# Patient Record
Sex: Female | Born: 1985 | Race: Black or African American | Hispanic: No | Marital: Single | Smoking: Never smoker
Health system: Southern US, Community
[De-identification: ages and names within clinical notes are randomized; demographics above are authoritative.]

## PROBLEM LIST (undated history)

## (undated) DIAGNOSIS — E282 Polycystic ovarian syndrome: Secondary | ICD-10-CM

## (undated) DIAGNOSIS — I10 Essential (primary) hypertension: Secondary | ICD-10-CM

## (undated) HISTORY — DX: Essential (primary) hypertension: I10

## (undated) HISTORY — PX: APPENDECTOMY: SHX54

---
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
## 2004-04-09 ENCOUNTER — Emergency Department (HOSPITAL_COMMUNITY): Admission: EM | Admit: 2004-04-09 | Discharge: 2004-04-09 | Payer: Self-pay | Admitting: Emergency Medicine

## 2005-04-06 ENCOUNTER — Emergency Department (HOSPITAL_COMMUNITY): Admission: EM | Admit: 2005-04-06 | Discharge: 2005-04-06 | Payer: Self-pay | Admitting: Emergency Medicine

## 2005-04-07 ENCOUNTER — Inpatient Hospital Stay (HOSPITAL_COMMUNITY): Admission: EM | Admit: 2005-04-07 | Discharge: 2005-04-08 | Payer: Self-pay | Admitting: Emergency Medicine

## 2005-04-07 ENCOUNTER — Encounter (INDEPENDENT_AMBULATORY_CARE_PROVIDER_SITE_OTHER): Payer: Self-pay | Admitting: Specialist

## 2005-12-20 ENCOUNTER — Emergency Department (HOSPITAL_COMMUNITY): Admission: EM | Admit: 2005-12-20 | Discharge: 2005-12-21 | Payer: Self-pay | Admitting: Emergency Medicine

## 2006-02-02 ENCOUNTER — Emergency Department (HOSPITAL_COMMUNITY): Admission: EM | Admit: 2006-02-02 | Discharge: 2006-02-03 | Payer: Self-pay | Admitting: Emergency Medicine

## 2006-05-18 ENCOUNTER — Emergency Department (HOSPITAL_COMMUNITY): Admission: EM | Admit: 2006-05-18 | Discharge: 2006-05-19 | Payer: Self-pay | Admitting: Emergency Medicine

## 2006-08-07 ENCOUNTER — Emergency Department (HOSPITAL_COMMUNITY): Admission: EM | Admit: 2006-08-07 | Discharge: 2006-08-07 | Payer: Self-pay | Admitting: Emergency Medicine

## 2006-12-22 ENCOUNTER — Emergency Department (HOSPITAL_COMMUNITY): Admission: EM | Admit: 2006-12-22 | Discharge: 2006-12-22 | Payer: Self-pay | Admitting: Emergency Medicine

## 2007-04-21 ENCOUNTER — Emergency Department (HOSPITAL_COMMUNITY): Admission: EM | Admit: 2007-04-21 | Discharge: 2007-04-21 | Payer: Self-pay | Admitting: Emergency Medicine

## 2007-07-25 ENCOUNTER — Emergency Department (HOSPITAL_COMMUNITY): Admission: EM | Admit: 2007-07-25 | Discharge: 2007-07-25 | Payer: Self-pay | Admitting: Emergency Medicine

## 2007-10-04 ENCOUNTER — Emergency Department (HOSPITAL_COMMUNITY): Admission: EM | Admit: 2007-10-04 | Discharge: 2007-10-05 | Payer: Self-pay | Admitting: Emergency Medicine

## 2008-07-21 ENCOUNTER — Emergency Department (HOSPITAL_COMMUNITY): Admission: EM | Admit: 2008-07-21 | Discharge: 2008-07-22 | Payer: Self-pay | Admitting: Emergency Medicine

## 2008-10-29 ENCOUNTER — Ambulatory Visit: Payer: Self-pay | Admitting: Obstetrics and Gynecology

## 2008-10-30 ENCOUNTER — Ambulatory Visit (HOSPITAL_COMMUNITY): Admission: RE | Admit: 2008-10-30 | Discharge: 2008-10-30 | Payer: Self-pay | Admitting: Family Medicine

## 2008-10-30 ENCOUNTER — Encounter: Payer: Self-pay | Admitting: Obstetrics and Gynecology

## 2008-10-30 LAB — CONVERTED CEMR LAB
Hgb A1c MFr Bld: 5.8 % (ref 4.6–6.1)
TSH: 2.451 microintl units/mL (ref 0.350–4.500)

## 2008-11-12 ENCOUNTER — Ambulatory Visit: Payer: Self-pay | Admitting: Obstetrics and Gynecology

## 2008-12-26 ENCOUNTER — Emergency Department (HOSPITAL_COMMUNITY): Admission: EM | Admit: 2008-12-26 | Discharge: 2008-12-27 | Payer: Self-pay | Admitting: Emergency Medicine

## 2008-12-28 ENCOUNTER — Emergency Department (HOSPITAL_COMMUNITY): Admission: EM | Admit: 2008-12-28 | Discharge: 2008-12-29 | Payer: Self-pay | Admitting: Emergency Medicine

## 2009-12-01 ENCOUNTER — Emergency Department (HOSPITAL_COMMUNITY): Admission: EM | Admit: 2009-12-01 | Discharge: 2009-12-01 | Payer: Self-pay | Admitting: Family Medicine

## 2010-12-20 NOTE — Group Therapy Note (Signed)
NAME:  Anna Medina, Anna Medina NO.:  [id number]   MEDICAL RECORD NO.:  [id number]          PATIENT TYPE:  WOC   LOCATION:  [facility]                   FACILITY:  [facility]   PHYSICIAN:  Argentina Donovan, MD        DATE OF BIRTH:  Dec 17, 1985   DATE OF SERVICE:                                  CLINIC NOTE   HISTORY:  The patient is a 25 year old nulligravida African American  female, 6 feet tall, weighing 408 pounds who over a period of many years  has gained weight.  When her periods first started, they were regular,  but as of the last few years she has had 1 or at the most 2 periods in a  year.  She had a period February 2010 and her previous one was in June  2009.  Her periods when she has them, are like a normal period.  They  have not been heavy.  She does show signs of hirsutism on her abdomen  and her face, and she of course has morbid obesity.  I have spent a long  time talking to her about polycystic ovarian syndrome and explaining the  condition to her.  Also talking about the relationship to insulin  insensitivity.  She [facility]  which was normal.  She has been trying get an appointment here for a  year she said.  She has always had normal Pap smears.  She is currently  sexually active, but not using any contraceptive.  She was using Yaz for  a while at the health department, but it started her bleeding all  different times and she said she could not tolerate that.  She seems to  understand the problem.   We are going to start her on Provera 10 mg each day for the first 10  days of each month.  We are going to get a pelvic ultrasound and some  lab work.  I am going to have her come back in 2 weeks.  We will decide  whether to start her on Glucophage at that point.  We have discussed the  possibility of an IUD and some other alternatives for contraception if  she starts ovulating.   IMPRESSION:  1. Polycystic ovarian  syndrome.  2. Hirsutism.  3. Amenorrhea.           ______________________________  Argentina Donovan, MD     PR/MEDQ  D:  10/29/2008  T:  10/29/2008  Job:  [id number]

## 2010-12-23 NOTE — Op Note (Signed)
[name], [name]             ACCOUNT NO.:  [id number]   MEDICAL RECORD NO.:  [id number]          PATIENT TYPE:  INP   LOCATION:  0103                         FACILITY:  [facility]   PHYSICIAN:  Anselm Pancoast. Weatherly, M.D.DATE OF BIRTH:  1986-08-06   DATE OF PROCEDURE:  04/07/2005  DATE OF DISCHARGE:                                 OPERATIVE REPORT   PREOPERATIVE DIAGNOSES:  Acute appendicitis, morbid obesity.   POSTOPERATIVE DIAGNOSES:  Acute appendicitis, morbid obesity. Gangrenous  appendix.   OPERATION:  Open appendectomy.   ANESTHESIA:  General.   SURGEON:  Anselm Pancoast. Zachery Dakins, M.D.   ASSISTANT:  Two scrub nurses.   HISTORY:  Anna Medina is a 25 year old female whose had progressive  lower abdominal pain predominantly right for 24 hours. She went to the [facility]  Emergency Room but had a long wait, was never seen, checked out and came  over here and arrived about midnight. Her white count was not elevated but  there was a left shift and she was certainly tender in the lower right  abdomen. A CT was obtained that showed an acutely inflamed appendix lateral  and possibly retrocecal but probably lateral kind of in the anterior pelvis.  The patient was started on Unasyn 3 grams, permission obtained for surgery  and she is here at this time. I think that if this is a retrocecal appendix,  I definitely need to do an open and since it is not very clear and her size,  I think it would be very difficult to try to do it with a laparoscope and  preferred an open appendectomy.   The patient was taken to the operative suite, positioned on the OR table,  induction of general anesthesia, she had received her Unasyn. The abdomen  was prepped with Betadine surgical scrub and solution, draped in a sterile  manner. I had sort of marked the skin trying to get the panniculus and etc.  I draped her in a sterile manner and then started the skin incision, it was  about 3 inches in length,  about 6 inches in depth, 5 inches is fatty tissue.  The external oblique aponeurosis was identified and this was opened, we were  just lateral to the rectus and the internal oblique fibers were kind of  split __________ fibers exposing the peritoneum and transversalis. This was  carefully opened, the cecum was at this area and I switched to the left side  so that the assistant could be pulling vigorously for exposures and we were  using an appendiceal and a __________ plus a Deaver medially. I could kind  of rotate the cecum medially, identify the markedly inflamed appendix and  then could grasp it and kind of pull it up into the wound. The appendiceal  mesentery was divided between [facility] and ligated with 2-0 Vicryl with good  hemostasis in the appendix. Fortunately the base of the appendix was not  inflamed. I tied it with a 2-0 Vicryl and then a purse-string 3-0 silk. The  appendix body was removed, the purse-string tied and a second Z stitch was  placed  over it for security. There was not any evidence of any perforation.  We thoroughly irrigated, checked for hemostasis, it appears good. Closure  was very carefully done. First I started off with a 2-0 Vicryl but switched  to an #0 Vicryl for a little larger needle and the peritoneum and  transversalis was closed with a running layer, and then the internal oblique  with a few interrupted sutures of 2-0 Vicryl, and then the external oblique  was closed with either interrupted or figure-of-eights of #0 Vicryl. The  wound was thoroughly irrigated, his Scarpa's was closed with 3-0 Vicryl and  then 3-0 Vicryl subcuticular with Benzoin and Steri-Strips on the skin. The  patient tolerated the procedure nicely and  should be able to get up and go to the bathroom, be kind of limited on  liquids today and hopefully be ready for discharge tomorrow. Because of her  size, I will give her another couple of doses of antibiotics but hopefully  she want  have any problems postoperatively.           ______________________________  Anselm Pancoast. Zachery Dakins, M.D.     WJW/MEDQ  D:  04/07/2005  T:  04/07/2005  Job:  [id number]

## 2010-12-23 NOTE — H&P (Signed)
NAMEJANESSA, Medina             ACCOUNT NO.:  [id number]   MEDICAL RECORD NO.:  [id number]          PATIENT TYPE:  INP   LOCATION:  0103                         FACILITY:  [facility]   PHYSICIAN:  Anselm Pancoast. Weatherly, M.D.DATE OF BIRTH:  [birth year]   DATE OF ADMISSION:  04/07/2005  DATE OF DISCHARGE:                                HISTORY & PHYSICAL   CHIEF COMPLAINT:  Abdominal pain.   HISTORY:  Anna Medina is a 25 year old approximately 360 pound black  female who started having abdominal pain yesterday. She went to the [facility]  emergency room, waited several hours, was not being seen and then in  frustration gave up and came to our emergency room, arriving here  approximately midnight. She was seen by the ER physician, she was definitely  tender in the lower abdomen. Her white count was not elevated at 9100 but  there was a significant left shift. She was given a contrast CT of the  abdomen that showed an acutely inflamed appendix kind of down lateral and  not truly retrocecal and I was called this morning approximately 5:45. I saw  her and she was definitely locally tender. She is also definitely extremely  heavy and as far as discussion of the options of laparoscopic versus an open  appendectomy, I think with her size and etc. that it would be safer because  I fear we are going  see a retrocecal appendix. I recommended we proceed  with an open appendectomy. Fortunately it was the time the employees were  arriving so that we will have help for our surgery.   PAST MEDICAL HISTORY:  She is on no chronic medications. She does not smoke  cigarettes. All of her family members are large and she has had no  pregnancies and she is not married.   PHYSICAL EXAMINATION:  GENERAL:  She is a pleasant, very heavy but young  appearing female complaining of pretty significant abdominal pain.  VITAL SIGNS:  In the emergency room, her temperature has always been normal  about the 97-98 range,  blood pressure is 118/77, pulse has been from 78-90  and her respirations are normal.  EYES/EARS/NOSE/THROAT:  She is well hydrated, there is no cervical  lymphadenopathy, good breath sounds.  BREASTS:  Negative.  CARDIAC:  Normal sinus rhythm.  ABDOMEN:  She is very tender in the right lower quadrant, she has a large  panniculus but the tenderness appears right __________ the anterior iliac  crest area well localized. The CT also shows the appendix is kind of lateral  to the cecum. Did not do a pelvic or rectal exam since confirmed  appendicitis by CT. She has got 3 grams of Unasyn, permission obtained for  open appendectomy and will proceed as soon as the OR can be set up for the  procedure.           ______________________________  Anselm Pancoast. Zachery Dakins, M.D.     WJW/MEDQ  D:  04/07/2005  T:  04/07/2005  Job:  [id number]

## 2011-04-28 LAB — HEMOGLOBIN AND HEMATOCRIT, BLOOD: HCT: 34.3 — ABNORMAL LOW

## 2011-05-11 LAB — GLUCOSE, CAPILLARY: Glucose-Capillary: 88 mg/dL (ref 70–99)

## 2011-05-19 LAB — COMPREHENSIVE METABOLIC PANEL
Albumin: 3.3 — ABNORMAL LOW
Alkaline Phosphatase: 73
BUN: 7
Calcium: 9.2
Creatinine, Ser: 0.72
Potassium: 4
Total Protein: 7.8

## 2011-05-19 LAB — DIFFERENTIAL
Basophils Absolute: 0
Basophils Relative: 1
Eosinophils Absolute: 0.1
Lymphocytes Relative: 25
Lymphs Abs: 1.2
Monocytes Absolute: 0.6
Monocytes Relative: 12 — ABNORMAL HIGH
Neutro Abs: 2.8

## 2011-05-19 LAB — URINALYSIS, ROUTINE W REFLEX MICROSCOPIC
Glucose, UA: NEGATIVE
Ketones, ur: NEGATIVE
Protein, ur: NEGATIVE

## 2011-05-19 LAB — CBC
HCT: 37.6
MCHC: 34.7
Platelets: 226
RDW: 13.1

## 2011-05-19 LAB — LIPASE, BLOOD: Lipase: 19

## 2011-05-19 LAB — PREGNANCY, URINE: Preg Test, Ur: NEGATIVE

## 2011-06-22 ENCOUNTER — Encounter: Payer: Self-pay | Admitting: Emergency Medicine

## 2011-06-22 ENCOUNTER — Emergency Department (HOSPITAL_COMMUNITY)
Admission: EM | Admit: 2011-06-22 | Discharge: 2011-06-22 | Disposition: A | Discharge status: Home / Self Care | Payer: Self-pay | Attending: Emergency Medicine | Admitting: Emergency Medicine

## 2011-06-22 ENCOUNTER — Emergency Department (HOSPITAL_COMMUNITY): Payer: Self-pay

## 2011-06-22 DIAGNOSIS — R112 Nausea with vomiting, unspecified: Secondary | ICD-10-CM | POA: Insufficient documentation

## 2011-06-22 DIAGNOSIS — R51 Headache: Secondary | ICD-10-CM | POA: Insufficient documentation

## 2011-06-22 HISTORY — DX: Polycystic ovarian syndrome: E28.2

## 2011-06-22 MED ORDER — SODIUM CHLORIDE 0.9 % IV SOLN
INTRAVENOUS | Status: DC
  Administered 2011-06-22: 10:00:00 via INTRAVENOUS

## 2011-06-22 MED ORDER — DIPHENHYDRAMINE HCL 50 MG/ML IJ SOLN
50.0000 mg | Freq: Once | INTRAMUSCULAR | Status: AC
  Administered 2011-06-22: 50 mg via INTRAVENOUS
  Filled 2011-06-22: qty 1

## 2011-06-22 MED ORDER — METOCLOPRAMIDE HCL 5 MG/ML IJ SOLN
10.0000 mg | Freq: Once | INTRAMUSCULAR | Status: AC
  Administered 2011-06-22: 10 mg via INTRAVENOUS
  Filled 2011-06-22: qty 2

## 2011-06-22 NOTE — ED Provider Notes (Signed)
History     CSN: [id number] Arrival date & time: 06/22/2011  8:48 AM    Chief Complaint  Patient presents with  . Headache  . Emesis  . Nausea   HPI Pt was seen at 0900.  Per pt, c/o gradual onset and persistence of constant right sided frontal headache since yesterday.  Describes the headache as "throbbing," has been assoc with N/V.  States she took a friend's vicodin last night with relief of the headache, but it came back today.  Pt endorses she has a hx of similar headaches.  Denies headache was sudden or maximal in onset or any any time.  Denies fevers, no visual changes, no eye pain, no focal motor weakness, no tingling/numbness in extremities.    Past Medical History  Diagnosis Date  . PCOS (polycystic ovarian syndrome)     Past Surgical History  Procedure Date  . Appendectomy      History  Substance Use Topics  . Smoking status: Never Smoker   . Smokeless tobacco: Not on file  . Alcohol Use: No   Review of Systems ROS: Statement: All systems negative except as marked or noted in the HPI; Constitutional: Negative for fever and chills. ; ; Eyes: Negative for eye pain, redness and discharge. ; ; ENMT: Negative for ear pain, hoarseness, nasal congestion, sinus pressure and sore throat. ; ; Cardiovascular: Negative for chest pain, palpitations, diaphoresis, dyspnea and peripheral edema. ; ; Respiratory: Negative for cough, wheezing and stridor. ; ; Gastrointestinal: +N/V.  Negative for diarrhea and abdominal pain, blood in stool, hematemesis, jaundice and rectal bleeding. . ; ; Genitourinary: Negative for dysuria, flank pain and hematuria. ; ; Musculoskeletal: Negative for back pain and neck pain. Negative for swelling and trauma.; ; Skin: Negative for pruritus, rash, abrasions, blisters, bruising and skin lesion.; ; Neuro: +frontal headache. Negative for lightheadedness and neck stiffness. Negative for weakness, altered level of consciousness , altered mental status, extremity  weakness, paresthesias, involuntary movement, seizure and syncope.     Allergies  Review of patient's allergies indicates no known allergies.  Home Medications   Current Outpatient Rx  Name Route Sig Dispense Refill  . ASPIRIN 325 MG PO TABS Oral Take 650 mg by mouth once.      Marland Kitchen DIPHENHYDRAMINE-APAP (SLEEP) 25-500 MG PO TABS Oral Take 1 tablet by mouth at bedtime as needed. sleep     . OXYCODONE HCL 5 MG PO CAPS Oral Take 10 mg by mouth once.        BP 160/104  Pulse 80  Temp(Src) 98 F (36.7 C) (Oral)  Resp 16  SpO2 99%  LMP 06/12/2011  Physical Exam 0905: Physical examination:  Nursing notes reviewed; Vital signs and O2 SAT reviewed;  Constitutional: Well developed, Well nourished, Well hydrated, In no acute distress; Head:  Normocephalic, atraumatic; Eyes: EOMI, PERRL, No scleral icterus; ENMT: TM's clear bilat.  +edemetous nasal turbinates bilat with clear rhinorrhea.  Mouth and pharynx normal, Mucous membranes moist; Neck: Supple, Full range of motion, No lymphadenopathy, no meningeal signs; Cardiovascular: Regular rate and rhythm, No murmur, rub, or gallop; Respiratory: Breath sounds clear & equal bilaterally, No rales, rhonchi, wheezes, or rub, Normal respiratory effort/excursion; Chest: Nontender, Movement normal; Abdomen: Soft, Nontender, Nondistended, Normal bowel sounds; Extremities: Pulses normal, No tenderness, No edema, No calf edema or asymmetry.; Neuro: AA&Ox3, Major CN grossly intact.  No facial droop, speech clear. No gross focal motor or sensory deficits in extremities.; Skin: Color normal, Warm, Dry, no rash.  ED Course  Procedures   MDM  MDM Reviewed: nursing note and vitals Interpretation: CT scan    Ct Head Wo Contrast  06/22/2011  *RADIOLOGY REPORT*  Clinical Data:  Headache.  Nausea and vomiting.  CT HEAD WITHOUT CONTRAST  Technique: Contiguous axial images were obtained from the base of the skull through the vertex without contrast  Comparison:  None  Findings:  There is no evidence of intracranial hemorrhage, brain edema, or other signs of acute infarction.  There is no evidence of intracranial mass lesion or mass effect.  No abnormal extraaxial fluid collections are identified.  There is no evidence of hydrocephalus, or other significant intracranial abnormality.  No skull abnormality identified.  IMPRESSION: Negative non-contrast head CT.  Original Report Authenticated By: Danae Orleans, M.D.    11:56 AM:   Feels improved and wants to go home now.  Dx testing d/w pt and family.  Questions answered.  Verb understanding, agreeable to d/c home with outpt f/u.       Lunna Vogelgesang Allison Quarry, DO 06/23/11 2155

## 2011-06-22 NOTE — ED Notes (Signed)
Patient transported to CT 

## 2011-06-22 NOTE — ED Notes (Signed)
Pt aox3, states ha since yesterday, 8/10 no hx migraines.

## 2011-06-22 NOTE — ED Notes (Signed)
Pt states that she had a headache yesterday with some n/v and that she felt better then this am having more nausea/v prior to going to work alert x4,

## 2011-06-22 NOTE — ED Notes (Signed)
Pt denies headache at this time also denies n/v. Pt states feels much better. amb pt to discharge window.

## 2011-11-30 IMAGING — CT CT HEAD W/O CM
2 series · 16 of 30 positions shown, 20 images · non-contrast
Comparison: None

CLINICAL DATA: Headache.  Nausea and vomiting.

CT HEAD WITHOUT CONTRAST
TECHNIQUE: Contiguous axial images were obtained from the base of
the skull through the vertex without contrast

[Series 2: head w/o · axial · non-contrast · 0.43mm/px · z∈[-633,-513]mm · 13 of 28 slices shown, 17 images]
[im 2/28  brain]
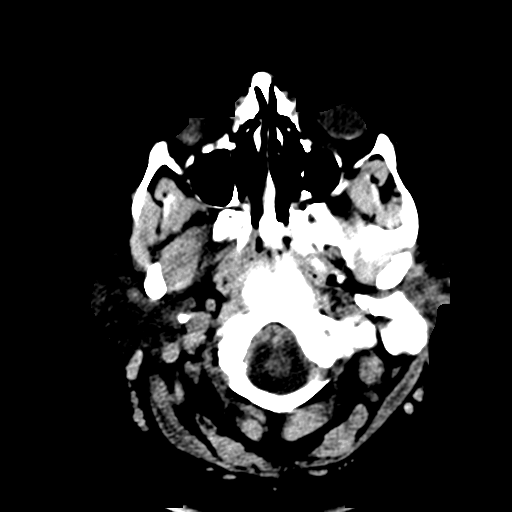
[im 2/28  bone]
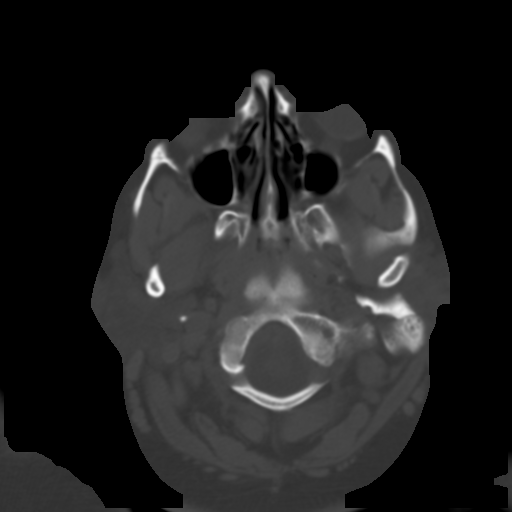
[im 4/28  brain]
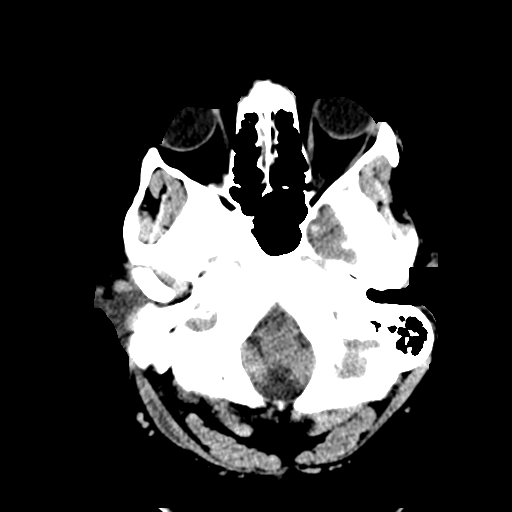
[im 6/28  brain]
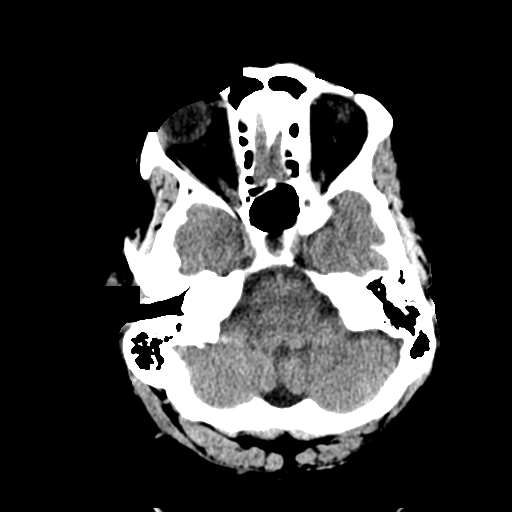
[im 8/28  brain]
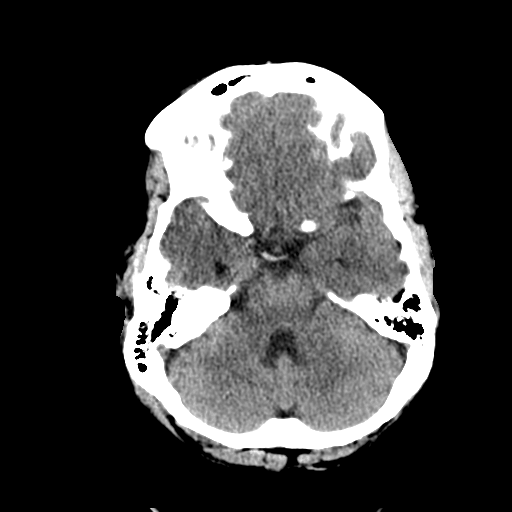
[im 10/28  brain]
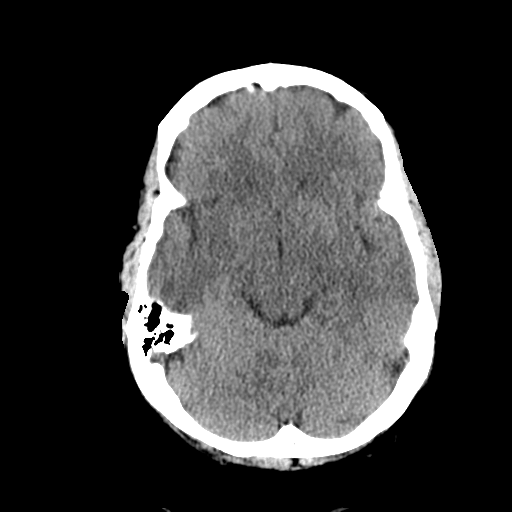
[im 10/28  bone]
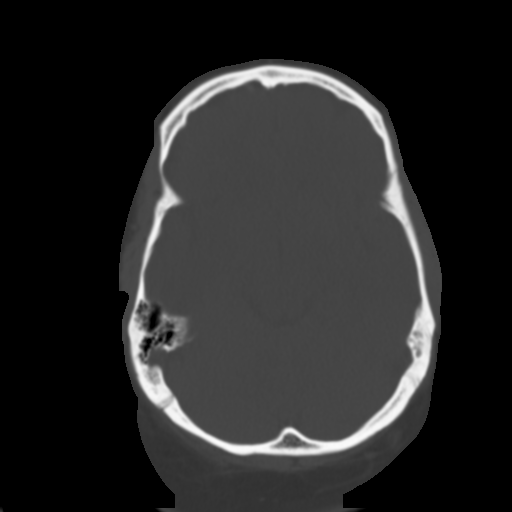
[im 12/28  brain]
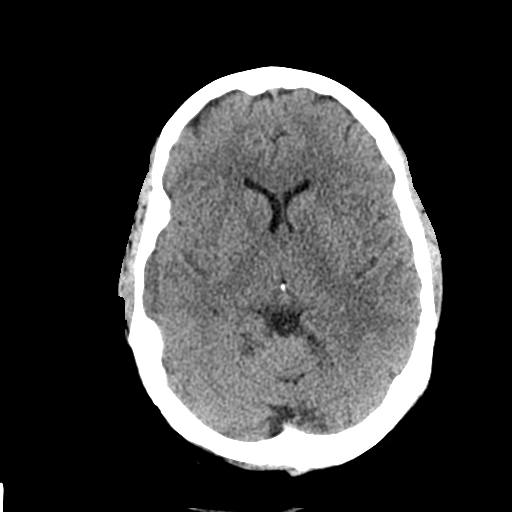
[im 14/28  brain]
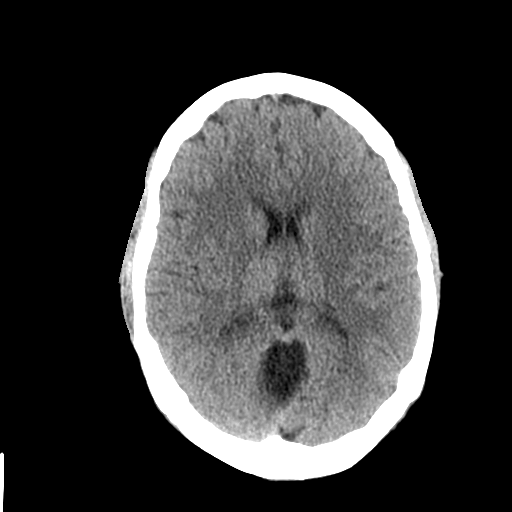
[im 16/28  brain]
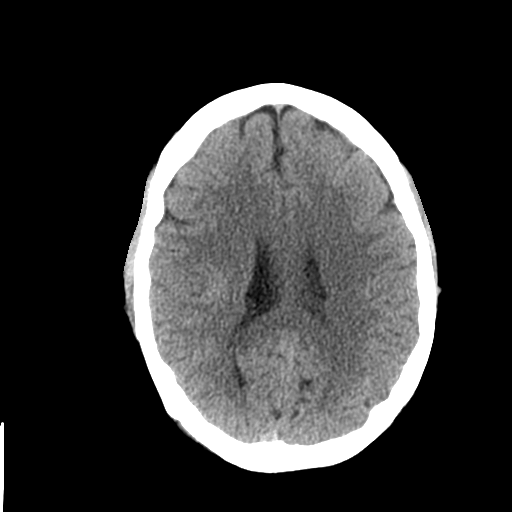
[im 18/28  brain]
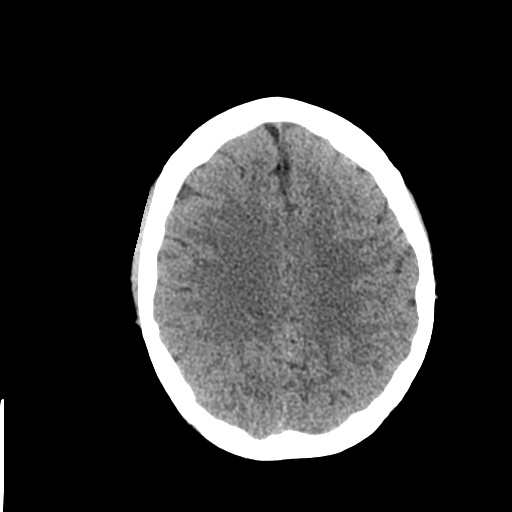
[im 18/28  bone]
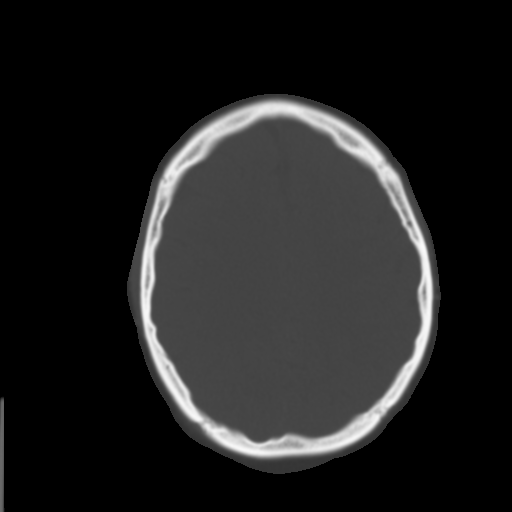
[im 20/28  brain]
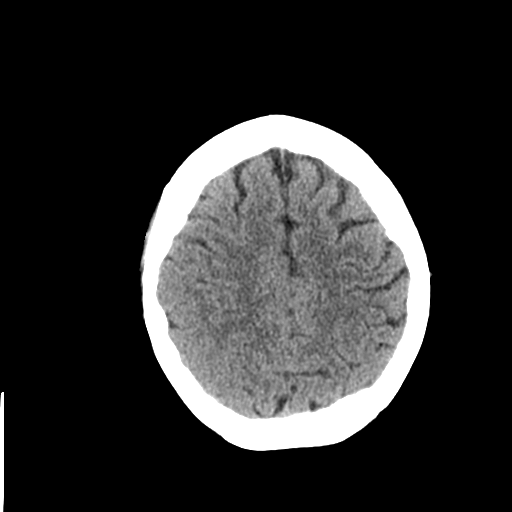
[im 22/28  brain]
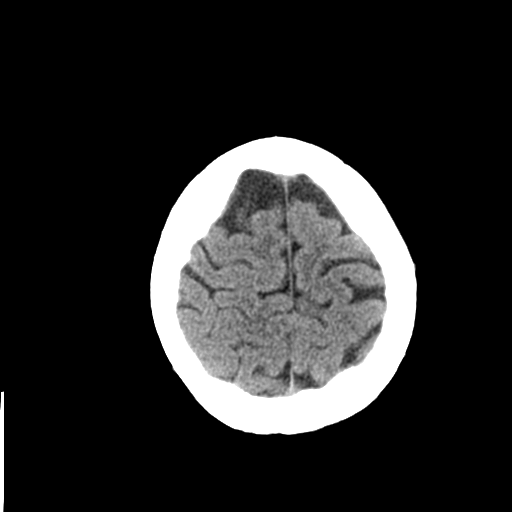
[im 24/28  brain]
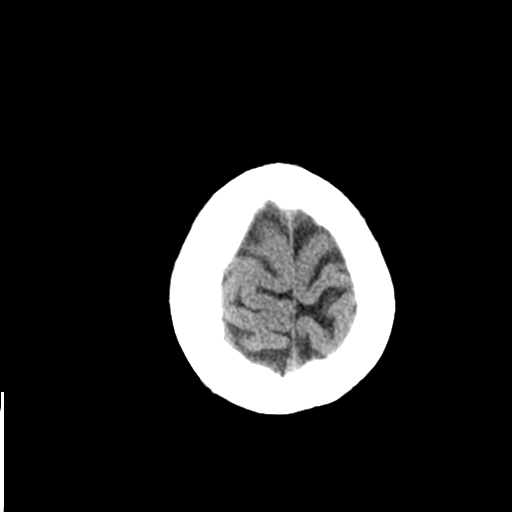
[im 26/28  brain]
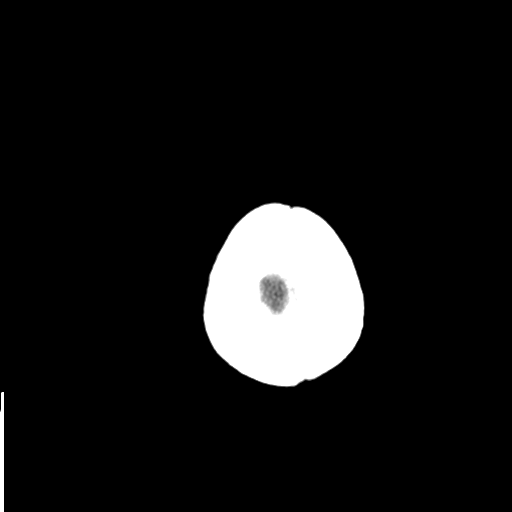
[im 26/28  bone]
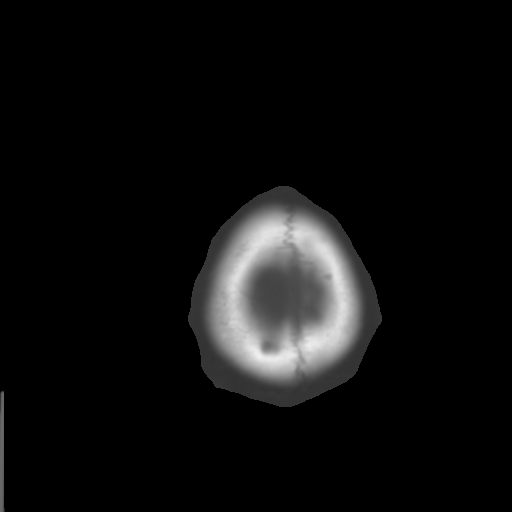

[Series 3: bone windows · axial · 0.43mm/px · z∈[-633,-593]mm · 3 of 28 slices shown]
[im 2/28  bone]
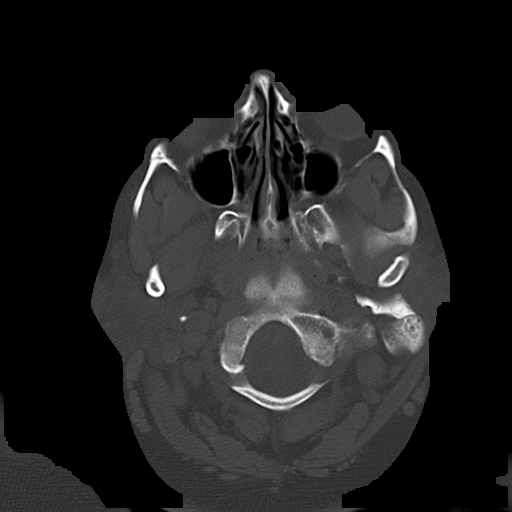
[im 6/28  bone]
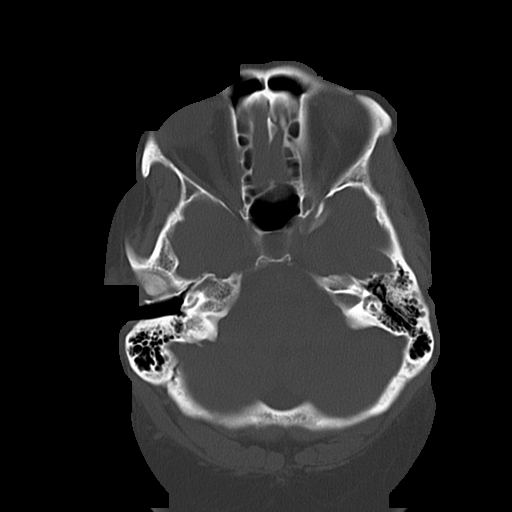
[im 10/28  bone]
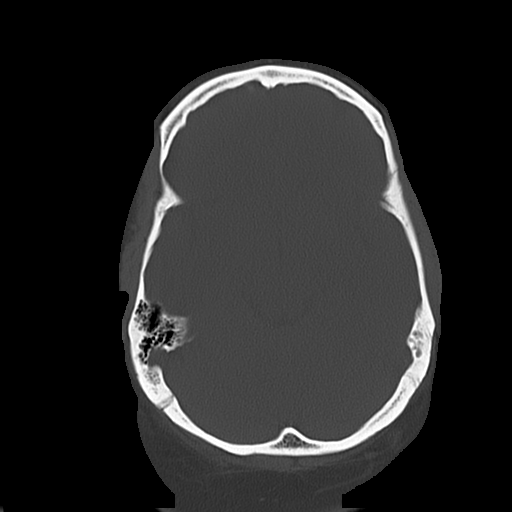

[16 of 30 positions shown; findings below may reference images not displayed]

FINDINGS: There is no evidence of intracranial hemorrhage, brain
edema, or other signs of acute infarction.  There is no evidence of
intracranial mass lesion or mass effect.  No abnormal extraaxial
fluid collections are identified.  There is no evidence of
hydrocephalus, or other significant intracranial abnormality.  No
skull abnormality identified.
IMPRESSION: Negative non-contrast head CT.

## 2012-09-18 ENCOUNTER — Emergency Department (HOSPITAL_COMMUNITY)
Admission: EM | Admit: 2012-09-18 | Discharge: 2012-09-18 | Disposition: A | Discharge status: Home / Self Care | Payer: Self-pay | Attending: Emergency Medicine | Admitting: Emergency Medicine

## 2012-09-18 ENCOUNTER — Encounter (HOSPITAL_COMMUNITY): Payer: Self-pay | Admitting: *Deleted

## 2012-09-18 DIAGNOSIS — Z8739 Personal history of other diseases of the musculoskeletal system and connective tissue: Secondary | ICD-10-CM | POA: Insufficient documentation

## 2012-09-18 DIAGNOSIS — Y929 Unspecified place or not applicable: Secondary | ICD-10-CM | POA: Insufficient documentation

## 2012-09-18 DIAGNOSIS — IMO0002 Reserved for concepts with insufficient information to code with codable children: Secondary | ICD-10-CM

## 2012-09-18 DIAGNOSIS — Z23 Encounter for immunization: Secondary | ICD-10-CM | POA: Insufficient documentation

## 2012-09-18 DIAGNOSIS — W268XXA Contact with other sharp object(s), not elsewhere classified, initial encounter: Secondary | ICD-10-CM | POA: Insufficient documentation

## 2012-09-18 DIAGNOSIS — S61209A Unspecified open wound of unspecified finger without damage to nail, initial encounter: Secondary | ICD-10-CM | POA: Insufficient documentation

## 2012-09-18 DIAGNOSIS — Y9389 Activity, other specified: Secondary | ICD-10-CM | POA: Insufficient documentation

## 2012-09-18 MED ORDER — TETANUS-DIPHTH-ACELL PERTUSSIS 5-2.5-18.5 LF-MCG/0.5 IM SUSP
0.5000 mL | Freq: Once | INTRAMUSCULAR | Status: AC
  Administered 2012-09-18: 0.5 mL via INTRAMUSCULAR
  Filled 2012-09-18: qty 0.5

## 2012-09-18 NOTE — ED Provider Notes (Signed)
History     CSN: [id number]  Arrival date & time 09/18/12  1914   First MD Initiated Contact with Patient 09/18/12 2006      Chief Complaint  Patient presents with  . Extremity Laceration    (Consider location/radiation/quality/duration/timing/severity/associated sxs/prior treatment) HPI Comments: This is a 27 year old female, who presents emergency department with chief complaint of finger laceration. Patient states that she was opening a can, when she cut her finger. Bleeding is controlled at this time. She states that her pain is mild. She cannot remember the last time she had a tetanus shot.  The history is provided by the patient. No language interpreter was used.    Past Medical History  Diagnosis Date  . PCOS (polycystic ovarian syndrome)     Past Surgical History  Procedure Laterality Date  . Appendectomy      History reviewed. No pertinent family history.  History  Substance Use Topics  . Smoking status: Never Smoker   . Smokeless tobacco: Not on file  . Alcohol Use: No    OB History   Grav Para Term Preterm Abortions TAB SAB Ect Mult Living                  Review of Systems  All other systems reviewed and are negative.    Allergies  Review of patient's allergies indicates no known allergies.  Home Medications   Current Outpatient Rx  Name  Route  Sig  Dispense  Refill  . ibuprofen (ADVIL,MOTRIN) 200 MG tablet   Oral   Take 400 mg by mouth every 6 (six) hours as needed for pain.           BP 160/80  Pulse 77  Temp(Src) 98.6 F (37 C) (Oral)  Resp 18  SpO2 97%  LMP 09/18/2012  Physical Exam  Nursing note and vitals reviewed. Constitutional: She is oriented to person, place, and time. She appears well-developed and well-nourished.  HENT:  Head: Normocephalic and atraumatic.  Neck: Normal range of motion.  Cardiovascular: Normal rate.   Pulmonary/Chest: Effort normal.  Abdominal: She exhibits no distension.  Musculoskeletal:  Normal range of motion. She exhibits no edema and no tenderness.  Right hand grip strength 5 out of 5, range of motion 5 out of 5, no signs of tendon damage.  Neurological: She is alert and oriented to person, place, and time.  Skin: Skin is warm and dry.  Right fifth finger laceration at the MCP on the palmar aspect, approximately 3 cm, no signs of tendon involvement, no debris in the wound, bleeding is controlled at this time  Psychiatric: She has a normal mood and affect. Her behavior is normal. Judgment and thought content normal.    ED Course  Procedures (including critical care time)  Labs Reviewed - No data to display No results found. LACERATION REPAIR Performed by: Roxy Horseman Authorized by: Roxy Horseman Consent: Verbal consent obtained. Risks and benefits: risks, benefits and alternatives were discussed Consent given by: patient Patient identity confirmed: provided demographic data Prepped and Draped in normal sterile fashion Wound explored  Laceration Location: Pulmonary aspect of right fifth finger  Laceration Length: 3 cm  No Foreign Bodies seen or palpated  Anesthesia: local infiltration  Local anesthetic: lidocaine 2 % without epinephrine  Anesthetic total: 3 ml  Irrigation method: syringe Amount of cleaning: standard  Skin closure: 5-0 monofilament   Number of sutures: 7   Technique: Simple interrupted   Patient tolerance: Patient tolerated the procedure well  with no immediate complications.   1. Laceration       MDM  27 year female with finger laceration. There appears to be no tendon involvement.  I examined the wound thoroughly with Dr. Estell Harpin.  The wound was repaired without incident.  Patient will follow-up in 7 days for suture removal.  She is agreeable with the plan.  She is stable and ready for discharge.  TDaP was updated.        Roxy Horseman, PA-C 09/18/12 2123

## 2012-09-18 NOTE — ED Notes (Signed)
Pt was reaching in a can and lacerated her right fifth digit. Bleeding has ceased with pressure from ems. Pt does not remember when last tetnus was.

## 2012-09-18 NOTE — ED Notes (Signed)
[id number]<AV> Expected date:<BR> Expected time:<BR> Means of arrival:<BR> Comments:<BR> EMS/cut pinkie finger on metal can

## 2012-09-18 NOTE — ED Provider Notes (Signed)
Medical screening examination/treatment/procedure(s) were performed by non-physician practitioner and as supervising physician I was immediately available for consultation/collaboration.   Kirandeep Fariss L Uldine Fuster, MD 09/18/12 2241 

## 2012-09-25 ENCOUNTER — Emergency Department (HOSPITAL_COMMUNITY)
Admission: EM | Admit: 2012-09-25 | Discharge: 2012-09-25 | Disposition: A | Discharge status: Home / Self Care | Payer: Self-pay | Attending: Emergency Medicine | Admitting: Emergency Medicine

## 2012-09-25 ENCOUNTER — Encounter (HOSPITAL_COMMUNITY): Payer: Self-pay | Admitting: Emergency Medicine

## 2012-09-25 DIAGNOSIS — Z4889 Encounter for other specified surgical aftercare: Secondary | ICD-10-CM

## 2012-09-25 DIAGNOSIS — Z8742 Personal history of other diseases of the female genital tract: Secondary | ICD-10-CM | POA: Insufficient documentation

## 2012-09-25 DIAGNOSIS — Z4802 Encounter for removal of sutures: Secondary | ICD-10-CM | POA: Insufficient documentation

## 2012-09-25 NOTE — ED Provider Notes (Signed)
History     CSN: [id number]  Arrival date & time 09/25/12  1100   First MD Initiated Contact with Patient 09/25/12 1114      Chief Complaint  Patient presents with  . Suture / Staple Removal    approx 7 sutures on r/hand, base of 5th finger    (Consider location/radiation/quality/duration/timing/severity/associated sxs/prior treatment) HPI Patient presents for suture removal.  Patient is told to return here in 7 days.  Patient states, is in no signs of infection Past Medical History  Diagnosis Date  . PCOS (polycystic ovarian syndrome)     Past Surgical History  Procedure Laterality Date  . Appendectomy      History reviewed. No pertinent family history.  History  Substance Use Topics  . Smoking status: Never Smoker   . Smokeless tobacco: Not on file  . Alcohol Use: No    OB History   Grav Para Term Preterm Abortions TAB SAB Ect Mult Living                  Review of Systems All other systems negative except as documented in the HPI. All pertinent positives and negatives as reviewed in the HPI. Allergies  Apple fruit extract  Home Medications   Current Outpatient Rx  Name  Route  Sig  Dispense  Refill  . hydrocortisone cream 1 %   Topical   Apply 1 application topically as needed. For eczema.         Marland Kitchen ibuprofen (ADVIL,MOTRIN) 200 MG tablet   Oral   Take 400 mg by mouth every 6 (six) hours as needed for pain. For pain.           BP 117/88  Pulse 72  Temp(Src) 98.3 F (36.8 C)  SpO2 99%  LMP 09/18/2012  Physical Exam  Nursing note and vitals reviewed. Constitutional: She appears well-developed and well-nourished.  Musculoskeletal:  Patient has a laceration to the base of her left fifth digit on the palmar aspect sutures are in place.  The wound does not appear ready for suture removal as they're still appears to be healing.    ED Course  Procedures (including critical care time)  Advised that she return here in another 5-7 days for  suture removal as 7 days in this area dispense insufficient for healing.  Patient is agreeable to this plan and advised to return sooner for any worsening   MDM          Carlyle Dolly, PA-C 09/25/12 1137

## 2012-09-25 NOTE — ED Provider Notes (Signed)
Medical screening examination/treatment/procedure(s) were performed by non-physician practitioner and as supervising physician I was immediately available for consultation/collaboration.  Donnetta Hutching, MD 09/25/12 1525

## 2012-09-25 NOTE — ED Notes (Signed)
Pt has a resolving sute line at the base of 5th finger r/hand. approx 7 sutures. Suture line intact. Denies drainage

## 2012-10-02 ENCOUNTER — Emergency Department (HOSPITAL_COMMUNITY)
Admission: EM | Admit: 2012-10-02 | Discharge: 2012-10-02 | Disposition: A | Discharge status: Home / Self Care | Payer: Self-pay | Attending: Emergency Medicine | Admitting: Emergency Medicine

## 2012-10-02 DIAGNOSIS — Z862 Personal history of diseases of the blood and blood-forming organs and certain disorders involving the immune mechanism: Secondary | ICD-10-CM | POA: Insufficient documentation

## 2012-10-02 DIAGNOSIS — Z4802 Encounter for removal of sutures: Secondary | ICD-10-CM | POA: Insufficient documentation

## 2012-10-02 DIAGNOSIS — Z8639 Personal history of other endocrine, nutritional and metabolic disease: Secondary | ICD-10-CM | POA: Insufficient documentation

## 2012-10-02 NOTE — ED Provider Notes (Signed)
History    This chart was scribed for non-physician practitioner working with Nelia Shi, MD by Smitty Pluck, ED scribe. This patient was seen in room WTR5/WTR5 and the patient's care was started at 5:01 PM.   CSN: [id number]  Arrival date & time 10/02/12  1623      No chief complaint on file.    The history is provided by the patient and medical records. No language interpreter was used.   GWENNETH WHITEMAN is a 27 y.o. female who presents to the Emergency Department due to suture removal from base of right 5th finger. Pt reports that she cut her right 5th finger while opening a can 2 weeks ago and had 7  sutures placed  She returned to the ED for suture removal 1 week ago and was told to come back in 1 week to allow proper healing. She denies pain at site of laceration, drainage, fever, chills, nausea, vomiting, diarrhea, weakness, cough, SOB and any other pain.   Past Medical History  Diagnosis Date  . PCOS (polycystic ovarian syndrome)     Past Surgical History  Procedure Laterality Date  . Appendectomy      No family history on file.  History  Substance Use Topics  . Smoking status: Never Smoker   . Smokeless tobacco: Not on file  . Alcohol Use: No    OB History   Grav Para Term Preterm Abortions TAB SAB Ect Mult Living                  Review of Systems  Constitutional: Negative for fever and chills.  Respiratory: Negative for cough and shortness of breath.   Gastrointestinal: Negative for nausea, vomiting and diarrhea.  Neurological: Negative for weakness.  All other systems reviewed and are negative.    Allergies  Apple fruit extract  Home Medications   Current Outpatient Rx  Name  Route  Sig  Dispense  Refill  . hydrocortisone cream 1 %   Topical   Apply 1 application topically as needed. For eczema.         Marland Kitchen ibuprofen (ADVIL,MOTRIN) 200 MG tablet   Oral   Take 400 mg by mouth every 6 (six) hours as needed for pain. For pain.            BP 150/101  Pulse 83  Temp(Src) 98.5 F (36.9 C) (Oral)  Resp 16  SpO2 100%  LMP 09/18/2012  Physical Exam  Nursing note and vitals reviewed. Constitutional: She is oriented to person, place, and time. She appears well-developed and well-nourished. No distress.  obese  HENT:  Head: Normocephalic and atraumatic.  Mouth/Throat: Oropharynx is clear and moist.  Eyes: Conjunctivae and EOM are normal.  Neck: Normal range of motion. Neck supple.  Cardiovascular: Normal rate, regular rhythm and normal heart sounds.   Pulmonary/Chest: Effort normal and breath sounds normal. No respiratory distress.  Abdominal: Soft. Bowel sounds are normal. She exhibits no distension.  Musculoskeletal: Normal range of motion. She exhibits no edema.  Neurological: She is alert and oriented to person, place, and time. No sensory deficit.  Skin: Skin is warm and dry.  7 sutures removed base right 5th finger MCP joint Non erythematous  No signs of infection Well healed No swelling    Psychiatric: She has a normal mood and affect. Her behavior is normal.    ED Course  Procedures (including critical care time) SUTURE REMOVAL Performed by: Johnnette Gourd  Consent: Verbal consent obtained. Patient  identity confirmed: provided demographic data Time out: Immediately prior to procedure a "time out" was called to verify the correct patient, procedure, equipment, support staff and site/side marked as required.  Location details: right 5th digit  Wound Appearance: clean  Sutures/Staples Removed: 7  Facility: sutures placed in this facility Patient tolerance: Patient tolerated the procedure well with no immediate complications.   DIAGNOSTIC STUDIES: Oxygen Saturation is 100% on room air, normal by my interpretation.    COORDINATION OF CARE: 5:07 PM Discussed ED treatment with pt and pt agrees.     Labs Reviewed - No data to display No results found.   1. Visit for suture removal        MDM  7 sutures removed. Wound care given. Well healed. No signs of infection.      I personally performed the services described in this documentation, which was scribed in my presence. The recorded information has been reviewed and is accurate.    Trevor Mace, PA-C 10/02/12 1734

## 2012-10-02 NOTE — ED Notes (Signed)
Pt had sutures in hand put in two weeks ago and has come to have them removed.

## 2012-10-06 NOTE — ED Provider Notes (Signed)
Medical screening examination/treatment/procedure(s) were performed by non-physician practitioner and as supervising physician I was immediately available for consultation/collaboration.    Aristidis Talerico L Kasha Howeth, MD 10/06/12 1507 

## 2013-02-02 ENCOUNTER — Emergency Department (HOSPITAL_COMMUNITY)
Admission: EM | Admit: 2013-02-02 | Discharge: 2013-02-02 | Disposition: A | Discharge status: Home / Self Care | Payer: BC Managed Care – PPO | Attending: Emergency Medicine | Admitting: Emergency Medicine

## 2013-02-02 ENCOUNTER — Encounter (HOSPITAL_COMMUNITY): Payer: Self-pay | Admitting: *Deleted

## 2013-02-02 DIAGNOSIS — Z862 Personal history of diseases of the blood and blood-forming organs and certain disorders involving the immune mechanism: Secondary | ICD-10-CM | POA: Insufficient documentation

## 2013-02-02 DIAGNOSIS — Z8639 Personal history of other endocrine, nutritional and metabolic disease: Secondary | ICD-10-CM | POA: Insufficient documentation

## 2013-02-02 DIAGNOSIS — K0889 Other specified disorders of teeth and supporting structures: Secondary | ICD-10-CM

## 2013-02-02 DIAGNOSIS — K029 Dental caries, unspecified: Secondary | ICD-10-CM | POA: Insufficient documentation

## 2013-02-02 DIAGNOSIS — K089 Disorder of teeth and supporting structures, unspecified: Secondary | ICD-10-CM | POA: Insufficient documentation

## 2013-02-02 MED ORDER — PROMETHAZINE HCL 25 MG PO TABS: 25.0000 mg | ORAL_TABLET | Freq: Four times a day (QID) | ORAL | Status: DC | PRN

## 2013-02-02 MED ORDER — PENICILLIN V POTASSIUM 500 MG PO TABS: 500.0000 mg | ORAL_TABLET | Freq: Four times a day (QID) | ORAL | Status: DC

## 2013-02-02 MED ORDER — OXYCODONE-ACETAMINOPHEN 5-325 MG PO TABS: 2.0000 | ORAL_TABLET | Freq: Four times a day (QID) | ORAL | Status: DC | PRN

## 2013-02-02 MED ORDER — ONDANSETRON 8 MG PO TBDP
8.0000 mg | ORAL_TABLET | Freq: Once | ORAL | Status: AC
  Administered 2013-02-02: 8 mg via ORAL
  Filled 2013-02-02: qty 1

## 2013-02-02 MED ORDER — OXYCODONE-ACETAMINOPHEN 5-325 MG PO TABS
2.0000 | ORAL_TABLET | Freq: Once | ORAL | Status: AC
  Administered 2013-02-02: 2 via ORAL
  Filled 2013-02-02: qty 2

## 2013-02-02 NOTE — ED Provider Notes (Signed)
History    This chart was scribed for non-physician practitioner, Mora Bellman, PA-C, working with Hilario Quarry, MD by Melba Coon, ED Scribe. This patient was seen in room WTR6/WTR6 and the patient's care was started at 11:19PM.  CSN: [id number] Arrival date & time 02/02/13  2139  None    Chief Complaint  Patient presents with  . Dental Pain   (Consider location/radiation/quality/duration/timing/severity/associated sxs/prior Treatment) The history is provided by the patient. No language interpreter was used.  HPI Comments: Anna Medina is a 27 y.o. female who presents to the Emergency Department complaining of constant, moderate to severe right lower molar dental pain with an onset 3 days ago. She reports the pain was intermittent for the past 2 weeks but has remained constant for the past 3 days and is getting progressively worse. She reports that sometimes it radiates and shoots up into her right ear or down into her right neck and right shoulder. Mouthwash, brushing her teeth, Orajel, and vanilla extract did not alleviate the pain. She denies having dental insurance. No known allergies to medications. No other pertinent medical symptoms.  Past Medical History  Diagnosis Date  . PCOS (polycystic ovarian syndrome)    Past Surgical History  Procedure Laterality Date  . Appendectomy     No family history on file. History  Substance Use Topics  . Smoking status: Never Smoker   . Smokeless tobacco: Not on file  . Alcohol Use: No   OB History   Grav Para Term Preterm Abortions TAB SAB Ect Mult Living                 Review of Systems  Constitutional: Negative for appetite change and fatigue.  HENT: Positive for dental problem. Negative for congestion, sore throat, sinus pressure and ear discharge.   Eyes: Negative for discharge.  Respiratory: Negative for cough.   Cardiovascular: Negative for chest pain.  Gastrointestinal: Negative for abdominal pain and  diarrhea.  Genitourinary: Negative for frequency and hematuria.  Musculoskeletal: Negative for back pain.  Skin: Negative for rash.  Neurological: Negative for seizures and headaches.  Psychiatric/Behavioral: Negative for hallucinations.  All other systems reviewed and are negative.    Allergies  Apple fruit extract  Home Medications   Current Outpatient Rx  Name  Route  Sig  Dispense  Refill  . benzocaine (ORAJEL) 10 % mucosal gel   Mouth/Throat   Use as directed 1 application in the mouth or throat as needed for pain (dental pain).         Marland Kitchen oxyCODONE-acetaminophen (PERCOCET/ROXICET) 5-325 MG per tablet   Oral   Take 2 tablets by mouth every 6 (six) hours as needed for pain.   6 tablet   0   . penicillin v potassium (VEETID) 500 MG tablet   Oral   Take 1 tablet (500 mg total) by mouth 4 (four) times daily.   40 tablet   0   . promethazine (PHENERGAN) 25 MG tablet   Oral   Take 1 tablet (25 mg total) by mouth every 6 (six) hours as needed for nausea.   12 tablet   0    BP 139/65  Pulse 76  Temp(Src) 98.6 F (37 C) (Oral)  Resp 20  SpO2 100%  LMP 01/02/2013 Physical Exam  Nursing note and vitals reviewed. Constitutional: She is oriented to person, place, and time. She appears well-developed and well-nourished. No distress.  HENT:  Head: Normocephalic and atraumatic. No trismus in  the jaw.  Right Ear: External ear normal.  Left Ear: External ear normal.  Nose: Nose normal.  Mouth/Throat: Uvula is midline and oropharynx is clear and moist.    Fracture and cavity to right inferior posterior 2nd molar with TTP.  No trismus, submental edema, or tongue elevation.  Eyes: Conjunctivae are normal.  Neck: Normal range of motion.  Cardiovascular: Normal rate, regular rhythm and normal heart sounds.   Pulmonary/Chest: Effort normal and breath sounds normal. No stridor. No respiratory distress. She has no wheezes. She has no rales.  Abdominal: Soft. She exhibits no  distension.  Musculoskeletal: Normal range of motion.  Neurological: She is alert and oriented to person, place, and time. She has normal strength.  Skin: Skin is warm and dry. She is not diaphoretic. No erythema.  Psychiatric: She has a normal mood and affect. Her behavior is normal.    ED Course  Procedures (including critical care time)  COORDINATION OF CARE:  11:22PM - Referred to on call dentist. She is Rx abx, pain medication, and anti-emetic medication. She is ready for d/c.  Labs Reviewed - No data to display No results found. 1. Pain, dental     MDM  Patient with toothache.  No gross abscess.  Exam unconcerning for Ludwig's angina or spread of infection.  Will treat with penicillin and pain medicine.  Urged patient to follow-up with dentist.      I personally performed the services described in this documentation, which was scribed in my presence. The recorded information has been reviewed and is accurate.     Mora Bellman, PA-C 02/03/13 [phone]

## 2013-02-02 NOTE — ED Notes (Signed)
Two weeks of tooth pain, Rt lower back molar,

## 2013-02-03 ENCOUNTER — Telehealth (HOSPITAL_COMMUNITY): Payer: Self-pay | Admitting: Emergency Medicine

## 2013-02-03 NOTE — ED Provider Notes (Signed)
History/physical exam/procedure(s) were performed by non-physician practitioner and as supervising physician I was immediately available for consultation/collaboration. I have reviewed all notes and am in agreement with care and plan.   Hilario Quarry, MD 02/03/13 [phone]

## 2013-08-28 ENCOUNTER — Other Ambulatory Visit: Payer: Self-pay | Admitting: Nurse Practitioner

## 2013-08-28 ENCOUNTER — Other Ambulatory Visit (HOSPITAL_COMMUNITY)
Admission: RE | Admit: 2013-08-28 | Discharge: 2013-08-28 | Disposition: A | Discharge status: Home / Self Care | Payer: 59 | Source: Ambulatory Visit | Attending: Obstetrics and Gynecology | Admitting: Obstetrics and Gynecology

## 2013-08-28 DIAGNOSIS — Z01419 Encounter for gynecological examination (general) (routine) without abnormal findings: Secondary | ICD-10-CM | POA: Insufficient documentation

## 2017-09-12 ENCOUNTER — Other Ambulatory Visit: Payer: Self-pay | Admitting: Obstetrics and Gynecology

## 2018-08-05 ENCOUNTER — Encounter (HOSPITAL_COMMUNITY): Payer: Self-pay | Admitting: Emergency Medicine

## 2018-08-05 ENCOUNTER — Emergency Department (HOSPITAL_COMMUNITY)
Admission: EM | Admit: 2018-08-05 | Discharge: 2018-08-05 | Disposition: A | Discharge status: Home / Self Care | Payer: 59 | Attending: Emergency Medicine | Admitting: Emergency Medicine

## 2018-08-05 DIAGNOSIS — R002 Palpitations: Secondary | ICD-10-CM | POA: Insufficient documentation

## 2018-08-05 DIAGNOSIS — R42 Dizziness and giddiness: Secondary | ICD-10-CM | POA: Diagnosis not present

## 2018-08-05 DIAGNOSIS — R030 Elevated blood-pressure reading, without diagnosis of hypertension: Secondary | ICD-10-CM | POA: Insufficient documentation

## 2018-08-05 LAB — URINALYSIS, ROUTINE W REFLEX MICROSCOPIC
BILIRUBIN URINE: NEGATIVE
GLUCOSE, UA: NEGATIVE mg/dL
HGB URINE DIPSTICK: NEGATIVE
Ketones, ur: NEGATIVE mg/dL
Leukocytes, UA: NEGATIVE
Nitrite: NEGATIVE
Protein, ur: NEGATIVE mg/dL
SPECIFIC GRAVITY, URINE: 1.025 (ref 1.005–1.030)
pH: 6 (ref 5.0–8.0)

## 2018-08-05 LAB — CBC
HCT: 38.8 % (ref 36.0–46.0)
HEMOGLOBIN: 12 g/dL (ref 12.0–15.0)
MCH: 28.6 pg (ref 26.0–34.0)
MCHC: 30.9 g/dL (ref 30.0–36.0)
MCV: 92.4 fL (ref 80.0–100.0)
PLATELETS: 272 10*3/uL (ref 150–400)
RBC: 4.2 MIL/uL (ref 3.87–5.11)
RDW: 14.6 % (ref 11.5–15.5)
WBC: 5.1 10*3/uL (ref 4.0–10.5)
nRBC: 0 % (ref 0.0–0.2)

## 2018-08-05 LAB — BASIC METABOLIC PANEL
Anion gap: 6 (ref 5–15)
BUN: 15 mg/dL (ref 6–20)
CO2: 28 mmol/L (ref 22–32)
Calcium: 8.7 mg/dL — ABNORMAL LOW (ref 8.9–10.3)
Chloride: 107 mmol/L (ref 98–111)
Creatinine, Ser: 0.89 mg/dL (ref 0.44–1.00)
GFR calc Af Amer: >60 mL/min (ref >60)
GFR calc non Af Amer: >60 mL/min (ref >60)
Glucose, Bld: 80 mg/dL (ref 70–99)
Potassium: 4.2 mmol/L (ref 3.5–5.1)
Sodium: 141 mmol/L (ref 135–145)

## 2018-08-05 LAB — CBG MONITORING, ED: Glucose-Capillary: 73 mg/dL (ref 70–99)

## 2018-08-05 LAB — I-STAT BETA HCG BLOOD, ED (MC, WL, AP ONLY): I-stat hCG, quantitative: <5 m[IU]/mL (ref <5)

## 2018-08-05 MED ORDER — SODIUM CHLORIDE 0.9 % IV BOLUS
1000.0000 mL | Freq: Once | INTRAVENOUS | Status: AC
  Administered 2018-08-05: 1000 mL via INTRAVENOUS

## 2018-08-05 MED ORDER — HYDROCHLOROTHIAZIDE 25 MG PO TABS: 25.0000 mg | ORAL_TABLET | Freq: Every day | ORAL | 0 refills | Status: DC

## 2018-08-05 NOTE — ED Provider Notes (Signed)
[facility]-EMERGENCY DEPT Provider Note   CSN: [id number] Arrival date & time: 08/05/18  1458     History   Chief Complaint Chief Complaint  Patient presents with  . Hypertension  . Dizziness    HPI Anna Medina is a 32 y.o. female.  HPI  Patient is a 32 year old female who developed acute onset palpitations intermittently over the past 3 days.  She has associated dizziness with the palpitations.  She currently is without lightheadedness or palpitations.  She denies disequilibrium.  She denies weakness of her arms or legs.  She denies headache.  No fevers or chills.  No recent change in her medications.  She was seen in urgent care and found to be hyper tensive and thus sent to the ER for further evaluation.  On arrival to the ER blood pressure is 168/142.  She is not currently on medications.  No significant dietary changes.  Her palpitations are described as a fast rate.  Not irregular.   Past Medical History:  Diagnosis Date  . PCOS (polycystic ovarian syndrome)     There are no active problems to display for this patient.   Past Surgical History:  Procedure Laterality Date  . APPENDECTOMY       OB History   No obstetric history on file.      Home Medications    Prior to Admission medications   Medication Sig Start Date End Date Taking? Authorizing Provider  ibuprofen (ADVIL,MOTRIN) 200 MG tablet Take 600 mg by mouth every 6 (six) hours as needed for mild pain.   Yes [provider]  hydrochlorothiazide (HYDRODIURIL) 25 MG tablet Take 1 tablet (25 mg total) by mouth daily. 08/05/18   Azalia Bilisampos, Davia Smyre, MD    Family History No family history on file.  Social History Social History   Tobacco Use  . Smoking status: Never Smoker  . Smokeless tobacco: Never Used  Substance Use Topics  . Alcohol use: No  . Drug use: No     Allergies   Apple fruit extract   Review of Systems Review of Systems  All other systems  reviewed and are negative.    Physical Exam Updated Vital Signs BP (!) 162/116   Pulse 71   Temp 98.2 F (36.8 C) (Oral)   Resp 18   Wt (!) 184.5 kg   SpO2 100%   Physical Exam Vitals signs and nursing note reviewed.  Constitutional:      General: She is not in acute distress.    Appearance: She is well-developed.  HENT:     Head: Normocephalic and atraumatic.  Eyes:     Pupils: Pupils are equal, round, and reactive to light.  Neck:     Musculoskeletal: Normal range of motion.  Cardiovascular:     Rate and Rhythm: Normal rate and regular rhythm.     Heart sounds: Normal heart sounds.  Pulmonary:     Effort: Pulmonary effort is normal.     Breath sounds: Normal breath sounds.  Abdominal:     General: There is no distension.     Palpations: Abdomen is soft.     Tenderness: There is no abdominal tenderness.  Musculoskeletal: Normal range of motion.  Skin:    General: Skin is warm and dry.  Neurological:     Mental Status: She is alert and oriented to person, place, and time.     Comments: 5/5 strength in major muscle groups of  bilateral upper and lower extremities. Speech  normal. No facial asymetry.   Psychiatric:        Judgment: Judgment normal.      ED Treatments / Results  Labs (all labs ordered are listed, but only abnormal results are displayed) Labs Reviewed  BASIC METABOLIC PANEL - Abnormal; Notable for the following components:      Result Value   Calcium 8.7 (*)    All other components within normal limits  URINALYSIS, ROUTINE W REFLEX MICROSCOPIC - Abnormal; Notable for the following components:   APPearance HAZY (*)    All other components within normal limits  CBC  CBG MONITORING, ED  I-STAT BETA HCG BLOOD, ED ([facility], [facility], [facility] ONLY)    EKG EKG Interpretation  Date/Time:  Monday August 05 2018 15:16:06 EST Ventricular Rate:  79 PR Interval:    QRS Duration: 93 QT Interval:  411 QTC Calculation: 472 R Axis:   64 Text Interpretation:   Sinus rhythm No significant change was found Confirmed by Azalia Bilisampos, Quantavia Frith ([id number]) on 08/05/2018 4:38:17 PM   Radiology No results found.  Procedures Procedures (including critical care time)  Medications Ordered in ED Medications  sodium chloride 0.9 % bolus 1,000 mL (1,000 mLs Intravenous New Bag/Given 08/05/18 1648)     Initial Impression / Assessment and Plan / ED Course  I have reviewed the triage vital signs and the nursing notes.  Pertinent labs & imaging results that were available during my care of the patient were reviewed by me and considered in my medical decision making (see chart for details).     Patient is asymptomatic at this time.  She feels much better.  No palpitations.  Observed on the monitor.  No ectopy noted.  Sinus rhythm now.  She will need outpatient cardiology follow-up for palpitations and likely Holter monitoring for evaluation for possible SVT versus A. fib with RVR.  No unilateral arm or leg weakness at this time.  Normal neurologic exam.  Notes from the urgent care reviewed and demonstrate normal neurologic exam there as well.  I do not think she needs advanced imaging of her head.  Doubt stroke.  Close primary care follow-up and cardiology follow-up.  Patient is encouraged to return to the ER for new or worsening symptoms  Final Clinical Impressions(s) / ED Diagnoses   Final diagnoses:  Palpitations  Elevated blood pressure reading    ED Discharge Orders         Ordered    hydrochlorothiazide (HYDRODIURIL) 25 MG tablet  Daily     08/05/18 1831           Azalia Bilisampos, Pieter Fooks, MD 08/05/18 [id number]

## 2018-08-05 NOTE — ED Notes (Signed)
Patient given discharge teaching and verbalized understanding. Patient ambulated out of ED with a steady gait. 

## 2018-08-05 NOTE — ED Triage Notes (Signed)
Patient here from urgent care with complaints of hypertension. Sent for evaluation. Dizziness x3 days.

## 2018-08-13 ENCOUNTER — Encounter: Payer: Self-pay | Admitting: Interventional Cardiology

## 2018-08-14 ENCOUNTER — Ambulatory Visit: Payer: 59 | Admitting: Interventional Cardiology

## 2018-09-19 ENCOUNTER — Encounter (HOSPITAL_COMMUNITY): Payer: Self-pay

## 2018-09-19 ENCOUNTER — Inpatient Hospital Stay (HOSPITAL_COMMUNITY)
Admission: AD | Admit: 2018-09-19 | Discharge: 2018-09-19 | Disposition: A | Discharge status: Home / Self Care | Payer: 59 | Attending: Obstetrics and Gynecology | Admitting: Obstetrics and Gynecology

## 2018-09-19 DIAGNOSIS — Z3202 Encounter for pregnancy test, result negative: Secondary | ICD-10-CM | POA: Insufficient documentation

## 2018-09-19 DIAGNOSIS — N939 Abnormal uterine and vaginal bleeding, unspecified: Secondary | ICD-10-CM | POA: Diagnosis present

## 2018-09-19 DIAGNOSIS — I10 Essential (primary) hypertension: Secondary | ICD-10-CM | POA: Insufficient documentation

## 2018-09-19 DIAGNOSIS — R1030 Lower abdominal pain, unspecified: Secondary | ICD-10-CM | POA: Insufficient documentation

## 2018-09-19 LAB — URINALYSIS, ROUTINE W REFLEX MICROSCOPIC
Bilirubin Urine: NEGATIVE
Glucose, UA: NEGATIVE mg/dL
Ketones, ur: NEGATIVE mg/dL
LEUKOCYTE UA: NEGATIVE
NITRITE: NEGATIVE
PROTEIN: NEGATIVE mg/dL
SPECIFIC GRAVITY, URINE: 1.02 (ref 1.005–1.030)
pH: 6.5 (ref 5.0–8.0)

## 2018-09-19 LAB — URINALYSIS, MICROSCOPIC (REFLEX): RBC / HPF: >50 RBC/hpf (ref 0–5)

## 2018-09-19 LAB — POCT PREGNANCY, URINE: Preg Test, Ur: NEGATIVE

## 2018-09-19 MED ORDER — NIFEDIPINE ER OSMOTIC RELEASE 30 MG PO TB24: 30.0000 mg | ORAL_TABLET | Freq: Two times a day (BID) | ORAL | 2 refills | Status: DC

## 2018-09-19 MED ORDER — NIFEDIPINE ER OSMOTIC RELEASE 30 MG PO TB24: 30.0000 mg | ORAL_TABLET | Freq: Two times a day (BID) | ORAL | 2 refills | Status: AC

## 2018-09-19 NOTE — Discharge Instructions (Signed)

## 2018-09-19 NOTE — MAU Provider Note (Addendum)
History     CSN: [id number]  Arrival date and time: 09/19/18 1343   First Provider Initiated Contact with Patient 09/19/18 1414      Chief Complaint  Patient presents with  . Vaginal Bleeding  . Abdominal Pain  . Back Pain   HPI Anna Medina is a 33 y.o. who presents to MAU with chief complaint of vaginal bleeding and lower abdominal cramping in the setting of positive HPT "a couple weeks ago". She endorses LMP of 08/16/2018. Her bleeding and cramping began yesterday. She denies physical symptoms of pregnancy including nausea/vomiting, breast tenderness. She denies other physical complaints. She has not taken medication or tried other treatments for this problem.  Patient's problem list includes chronic hypertension. She was prescribed HCTZ 25mg  daily when she was discharged from [facility] ED on 08/05/2018. Patient said the followed up with her OB/GYN for an annual appointment and was told she should discontinue HCTZ "because it is not safe in pregnancy".  She has 23 pills remaining of her original 30-pill prescription. She states she has never had an elevated bp on her home cuff.  Pertinent Gynecological History: Menses: flow is moderate Contraception: none Sexually transmitted diseases: no past history    Past Medical History:  Diagnosis Date  . Hypertension   . PCOS (polycystic ovarian syndrome)     Past Surgical History:  Procedure Laterality Date  . APPENDECTOMY      History reviewed. No pertinent family history.  Social History   Tobacco Use  . Smoking status: Never Smoker  . Smokeless tobacco: Never Used  Substance Use Topics  . Alcohol use: No  . Drug use: No    Allergies:  Allergies  Allergen Reactions  . Apple Fruit Extract Rash    Apple juice    Medications Prior to Admission  Medication Sig Dispense Refill Last Dose  . hydrochlorothiazide (HYDRODIURIL) 25 MG tablet Take 1 tablet (25 mg total) by mouth daily. 30 tablet 0 09/19/2018  .  ibuprofen (ADVIL,MOTRIN) 200 MG tablet Take 600 mg by mouth every 6 (six) hours as needed for mild pain.   09/19/2018    Review of Systems  Constitutional: Negative for chills, fatigue and fever.  Respiratory: Negative for chest tightness and shortness of breath.   Gastrointestinal: Positive for abdominal pain. Negative for diarrhea, nausea and vomiting.  Genitourinary: Positive for vaginal bleeding. Negative for vaginal discharge and vaginal pain.  Musculoskeletal: Negative for back pain.  Neurological: Negative for dizziness, seizures and headaches.  All other systems reviewed and are negative.  Physical Exam   Blood pressure (!) 208/111, pulse 76, temperature 99.2 F (37.3 C), temperature source Oral, resp. rate 18, height 5\' 10"  (1.778 m), weight (!) 187 kg, last menstrual period 08/16/2018.  Physical Exam  Nursing note and vitals reviewed. Constitutional: She is oriented to person, place, and time. She appears well-developed and well-nourished.  Respiratory: Effort normal and breath sounds normal. No accessory muscle usage. No tachypnea. No respiratory distress. She has no wheezes.  GI: Soft. She exhibits no distension. There is no abdominal tenderness. There is no rebound and no guarding.  Genitourinary:    Genitourinary Comments: Scant vaginal bleeding   Neurological: She is alert and oriented to person, place, and time. No cranial nerve deficit. Coordination normal.  Skin: Skin is warm and dry.  Psychiatric: She has a normal mood and affect. Her behavior is normal. Judgment and thought content normal.    MAU Course/MDM  Procedures  --Medication regimen discussed with  Dr. Macon Large in context of desired pregnancy.  --Negative pregnancy test --Scant vaginal bleeding in MAU --Pertinent negatives: fever, abdominal tenderness, flank pain, hx kidney stones or UTIs --Elevated pregnancy, not complaint with previously prescribed medication, consistent with patient baseline --Patient  asymptomatic, no concerning findings on physical exam  Patient Vitals for the past 24 hrs:  BP Temp Temp src Pulse Resp Height Weight  09/19/18 1450 (!) 188/115 - - 74 - - -  09/19/18 1411 (!) 208/111 99.2 F (37.3 C) Oral 76 18 - -  09/19/18 1357 - - - - - 5\' 10"  (1.778 m) (!) 187 kg    Results for orders placed or performed during the hospital encounter of 09/19/18 (from the past 24 hour(s))  Urinalysis, Routine w reflex microscopic     Status: Abnormal   Collection Time: 09/19/18  2:23 PM  Result Value Ref Range   Color, Urine AMBER (A) YELLOW   APPearance HAZY (A) CLEAR   Specific Gravity, Urine 1.020 1.005 - 1.030   pH 6.5 5.0 - 8.0   Glucose, UA NEGATIVE NEGATIVE mg/dL   Hgb urine dipstick LARGE (A) NEGATIVE   Bilirubin Urine NEGATIVE NEGATIVE   Ketones, ur NEGATIVE NEGATIVE mg/dL   Protein, ur NEGATIVE NEGATIVE mg/dL   Nitrite NEGATIVE NEGATIVE   Leukocytes,Ua NEGATIVE NEGATIVE  Pregnancy, urine POC     Status: None   Collection Time: 09/19/18  2:27 PM  Result Value Ref Range   Preg Test, Ur NEGATIVE NEGATIVE    Meds ordered this encounter  Medications  . NIFEdipine (PROCARDIA-XL/NIFEDICAL-XL) 30 MG 24 hr tablet    Sig: Take 1 tablet (30 mg total) by mouth 2 (two) times daily.    Dispense:  30 tablet    Refill:  2    Order Specific Question:   Supervising Provider    Answer:   Reva Bores [[id number]]    Assessment and Plan  --33 y.o. G0P0, negative pregnancy test --Vaginal bleeding associated with menstrual cycle --Per Dr. Macon Large, rx Procardia XL 30mg  BID --Discharge home in stable condition  F/U: Patient to establish care with PCP for BP management  Calvert Cantor, CNM 09/19/2018, 3:02 PM

## 2018-09-19 NOTE — MAU Note (Signed)
Anna Medina is a 32 y.o. here in MAU reporting: reports + UPT a couple of weeks ago. Started spotting on Sunday, reports it was after intercourse. Noticed some more pink spotting this morning. States she felt a golfball clot come out and since then she has had bright red bleeding. Also having right, lower back pain and abdominal cramping.  LMP: 08/16/18  Onset of complaint: Sunday   Pain score: back pain 6/10, abdominal pain 7/10  Vitals:   09/19/18 1411  BP: (!) 208/111  Pulse: 76  Resp: 18  Temp: 99.2 F (37.3 C)      Lab orders placed from triage: UA, UPT pt states she is unable to void at this time, labeled specimen cup provided to pt

## 2018-11-07 ENCOUNTER — Encounter
